# Patient Record
Sex: Female | Born: 1940 | Race: White | Hispanic: No | Smoking: Never smoker
Health system: Southern US, Community
[De-identification: ages and names within clinical notes are randomized; demographics above are authoritative.]

## PROBLEM LIST (undated history)

## (undated) DIAGNOSIS — I10 Essential (primary) hypertension: Secondary | ICD-10-CM

---

## 2000-08-23 ENCOUNTER — Other Ambulatory Visit: Admission: RE | Admit: 2000-08-23 | Discharge: 2000-08-23 | Payer: Self-pay | Admitting: Family Medicine

## 2000-10-19 ENCOUNTER — Ambulatory Visit (HOSPITAL_COMMUNITY): Admission: RE | Admit: 2000-10-19 | Discharge: 2000-10-19 | Payer: Self-pay | Admitting: Gastroenterology

## 2000-10-19 ENCOUNTER — Encounter (INDEPENDENT_AMBULATORY_CARE_PROVIDER_SITE_OTHER): Payer: Self-pay | Admitting: Specialist

## 2002-07-01 ENCOUNTER — Encounter: Payer: Self-pay | Admitting: Emergency Medicine

## 2002-07-01 ENCOUNTER — Emergency Department (HOSPITAL_COMMUNITY): Admission: EM | Admit: 2002-07-01 | Discharge: 2002-07-01 | Payer: Self-pay | Admitting: Emergency Medicine

## 2002-07-02 ENCOUNTER — Encounter (INDEPENDENT_AMBULATORY_CARE_PROVIDER_SITE_OTHER): Payer: Self-pay | Admitting: Cardiology

## 2002-07-02 ENCOUNTER — Ambulatory Visit (HOSPITAL_COMMUNITY): Admission: RE | Admit: 2002-07-02 | Discharge: 2002-07-02 | Payer: Self-pay | Admitting: Family Medicine

## 2003-12-01 ENCOUNTER — Other Ambulatory Visit: Admission: RE | Admit: 2003-12-01 | Discharge: 2003-12-01 | Payer: Self-pay | Admitting: Family Medicine

## 2004-11-16 ENCOUNTER — Ambulatory Visit (HOSPITAL_COMMUNITY): Admission: RE | Admit: 2004-11-16 | Discharge: 2004-11-16 | Payer: Self-pay | Admitting: Gastroenterology

## 2004-11-16 ENCOUNTER — Encounter (INDEPENDENT_AMBULATORY_CARE_PROVIDER_SITE_OTHER): Payer: Self-pay | Admitting: *Deleted

## 2004-12-07 ENCOUNTER — Other Ambulatory Visit: Admission: RE | Admit: 2004-12-07 | Discharge: 2004-12-07 | Payer: Self-pay | Admitting: Family Medicine

## 2005-12-26 ENCOUNTER — Other Ambulatory Visit: Admission: RE | Admit: 2005-12-26 | Discharge: 2005-12-26 | Payer: Self-pay | Admitting: Family Medicine

## 2006-12-28 ENCOUNTER — Other Ambulatory Visit: Admission: RE | Admit: 2006-12-28 | Discharge: 2006-12-28 | Payer: Self-pay | Admitting: Family Medicine

## 2007-11-06 ENCOUNTER — Encounter: Admission: RE | Admit: 2007-11-06 | Discharge: 2007-11-06 | Payer: Self-pay | Admitting: Family Medicine

## 2007-12-31 ENCOUNTER — Other Ambulatory Visit: Admission: RE | Admit: 2007-12-31 | Discharge: 2007-12-31 | Payer: Self-pay | Admitting: Family Medicine

## 2011-01-07 NOTE — Op Note (Signed)
NAMECASSIDEY, BARRALES             ACCOUNT NO.:  [id number]   MEDICAL RECORD NO.:  [id number]          PATIENT TYPE:  AMB   LOCATION:  ENDO                         FACILITY:  [facility]   PHYSICIAN:  Petra Kuba, M.D.    DATE OF BIRTH:  27-Oct-1940   DATE OF PROCEDURE:  11/16/2004  DATE OF DISCHARGE:                                 OPERATIVE REPORT   PROCEDURE:  Esophagogastroduodenoscopy with biopsy.   INDICATION:  Reflux, wanted to do a one-time endoscopy to rule out  Barrett's.   CONSENT:  Consent was signed after risks, benefits, methods, and options  were thoroughly discussed in the office.   ADDITIONAL MEDICATIONS FOR THIS PROCEDURE:  Demerol 20, Versed 2.   PROCEDURE:  The video endoscope was inserted by direct vision. The esophagus  was normal.  The scope was passed into the stomach and multiple gastric  polyps along the mid stomach were seen.  She did have significant linear  antritis.  The scope passed through a normal pylorus into a normal duodenal  bulb and around the celiac to a normal portion of the duodenum.  The scope  was withdrawn back to the bulb and a good look there ruled out abnormalities  in that location.  The scope was withdrawn back to the stomach and  retroflexed.  Cardia and fundus were normal.  There was noe proximal gastric  polyp, small, on retroflexion.  Straight visualization confirmed the polyps,  ruled out any other abnormalities.  A few biopsies of the antrum and the  linear antritis were obtained and put in a second container and a few  biopsies of multiple polyps were obtained including the one on retroflexion  and put in a third container.  Not all polyps were sampled.  Air was  suctioned and the scope was slowly withdrawn.  Again a good look at the  esophagus was normal.  The scope was removed.  The patient tolerated the  procedure well.  There was no obvious immediate complication.   ENDOSCOPIC DIAGNOSES:  1.  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  2.  Linear antritis, status post biopsy.  3.  Otherwise normal esophagogastroduodenoscopy.   PLAN:  1.  Await pathology.  2.  Continue pump inhibitors.  3.  Happy to see back p.r.n.  Otherwise return care to Dr. Andrey Campanile for the      customary healthcare maintenance and screening.      MEM/MEDQ  D:  11/16/2004  T:  11/16/2004  Job:  [id number]   cc:   Vale Haven. Andrey Campanile, M.D.  [address]  [place]  [place] [address]  Fax: [phone]

## 2011-01-07 NOTE — Op Note (Signed)
Ann Carlson, Ann Carlson             ACCOUNT NO.:  [id number]   MEDICAL RECORD NO.:  [id number]          PATIENT TYPE:  AMB   LOCATION:  ENDO                         FACILITY:  [facility]   PHYSICIAN:  Petra Kuba, M.D.    DATE OF BIRTH:  February 06, 1941   DATE OF PROCEDURE:  11/16/2004  DATE OF DISCHARGE:                                 OPERATIVE REPORT   PROCEDURES:  1.  Colonoscopy.  2.  Polypectomy.   INDICATIONS:  A patient with a history of colon polyps and family history of  colon polyps and colon cancer.  Due for colonic screening.  Consent was  signed after the risks, benefits, methods and options were thoroughly  discussed in the office on multiple occasions.   MEDICINES USED:  Demerol 40 mg and Versed 4 mg.   DESCRIPTION OF PROCEDURE:  Rectal inspection was pertinent for external  hemorrhoids, small.  The digital exam was negative.  The video pediatric  adjustable colonoscope was inserted and easily advanced around the colon to  the cecum.  This did require some abdominal pressure, but no position  changes.  No abnormality was seen on insertion.  The cecum was identified by  the appendiceal orifice and the ileocecal valve.  The scope was slowly  withdrawn.  Prep was fairly adequate.  With lots of washing and suctioning,  adequate visualization was obtained with the exception of around the splenic  flexure where some formed stool was seen, which could not be washed and  suctioned.  Otherwise the cecum, ascending, transverse, majority of the  descending and sigmoid were all normal, except for one distal to mid small  sigmoid polyp which was hot biopsied x 2.  No other abnormalities were seen.  We slowly withdrew back to the rectum.  Anorectal pull through and  retroflexion confirmed some small hemorrhoids.  The scope was straightened  and readvanced a short ways up the left side of the colon.  Air was suction  and scope removed.  The patient tolerated the procedure well.  There  were no  obvious immediate complications.   ENDOSCOPIC DIAGNOSES:  1.  Internal and external small hemorrhoids.  2.  Mid to distal tiny to small polyp hot biopsied x 2.  3.  Otherwise within normal limits to the cecum, except for some stool in      the proximal left splenic flexure which could not be washed off.   PLAN:  1.  Recheck colon screening in five years.  2.  Yearly rectals and guaiacs per Dr. Andrey Campanile.  3.  Await pathology to make sure of above.      MEM/MEDQ  D:  11/16/2004  T:  11/16/2004  Job:  [id number]   cc:   Vale Haven. Andrey Campanile, M.D.  [address]  [place]  [place] [address]  Fax: [phone]

## 2015-10-13 DIAGNOSIS — F418 Other specified anxiety disorders: Secondary | ICD-10-CM | POA: Diagnosis not present

## 2015-10-13 DIAGNOSIS — Z6839 Body mass index (BMI) 39.0-39.9, adult: Secondary | ICD-10-CM | POA: Diagnosis not present

## 2015-10-13 DIAGNOSIS — I10 Essential (primary) hypertension: Secondary | ICD-10-CM | POA: Diagnosis not present

## 2015-11-26 DIAGNOSIS — R7301 Impaired fasting glucose: Secondary | ICD-10-CM | POA: Diagnosis not present

## 2015-11-26 DIAGNOSIS — E784 Other hyperlipidemia: Secondary | ICD-10-CM | POA: Diagnosis not present

## 2015-11-26 DIAGNOSIS — K635 Polyp of colon: Secondary | ICD-10-CM | POA: Diagnosis not present

## 2015-11-26 DIAGNOSIS — Z1389 Encounter for screening for other disorder: Secondary | ICD-10-CM | POA: Diagnosis not present

## 2015-11-26 DIAGNOSIS — I10 Essential (primary) hypertension: Secondary | ICD-10-CM | POA: Diagnosis not present

## 2015-11-26 DIAGNOSIS — F418 Other specified anxiety disorders: Secondary | ICD-10-CM | POA: Diagnosis not present

## 2016-03-07 DIAGNOSIS — H40013 Open angle with borderline findings, low risk, bilateral: Secondary | ICD-10-CM | POA: Diagnosis not present

## 2016-06-03 DIAGNOSIS — Z803 Family history of malignant neoplasm of breast: Secondary | ICD-10-CM | POA: Diagnosis not present

## 2016-06-03 DIAGNOSIS — Z1231 Encounter for screening mammogram for malignant neoplasm of breast: Secondary | ICD-10-CM | POA: Diagnosis not present

## 2016-06-10 DIAGNOSIS — E785 Hyperlipidemia, unspecified: Secondary | ICD-10-CM | POA: Diagnosis not present

## 2016-06-10 DIAGNOSIS — R8299 Other abnormal findings in urine: Secondary | ICD-10-CM | POA: Diagnosis not present

## 2016-06-10 DIAGNOSIS — M859 Disorder of bone density and structure, unspecified: Secondary | ICD-10-CM | POA: Diagnosis not present

## 2016-06-10 DIAGNOSIS — I10 Essential (primary) hypertension: Secondary | ICD-10-CM | POA: Diagnosis not present

## 2016-06-10 DIAGNOSIS — E038 Other specified hypothyroidism: Secondary | ICD-10-CM | POA: Diagnosis not present

## 2016-06-10 DIAGNOSIS — R7301 Impaired fasting glucose: Secondary | ICD-10-CM | POA: Diagnosis not present

## 2016-06-10 DIAGNOSIS — N390 Urinary tract infection, site not specified: Secondary | ICD-10-CM | POA: Diagnosis not present

## 2016-06-17 DIAGNOSIS — M1990 Unspecified osteoarthritis, unspecified site: Secondary | ICD-10-CM | POA: Diagnosis not present

## 2016-06-17 DIAGNOSIS — E039 Hypothyroidism, unspecified: Secondary | ICD-10-CM | POA: Diagnosis not present

## 2016-06-17 DIAGNOSIS — Z0000 Encounter for general adult medical examination without abnormal findings: Secondary | ICD-10-CM | POA: Diagnosis not present

## 2016-06-17 DIAGNOSIS — R7301 Impaired fasting glucose: Secondary | ICD-10-CM | POA: Diagnosis not present

## 2016-06-17 DIAGNOSIS — M419 Scoliosis, unspecified: Secondary | ICD-10-CM | POA: Diagnosis not present

## 2016-06-17 DIAGNOSIS — I10 Essential (primary) hypertension: Secondary | ICD-10-CM | POA: Diagnosis not present

## 2016-06-17 DIAGNOSIS — K219 Gastro-esophageal reflux disease without esophagitis: Secondary | ICD-10-CM | POA: Diagnosis not present

## 2016-06-17 DIAGNOSIS — E785 Hyperlipidemia, unspecified: Secondary | ICD-10-CM | POA: Diagnosis not present

## 2016-06-17 DIAGNOSIS — M8580 Other specified disorders of bone density and structure, unspecified site: Secondary | ICD-10-CM | POA: Diagnosis not present

## 2016-06-23 DIAGNOSIS — Z1212 Encounter for screening for malignant neoplasm of rectum: Secondary | ICD-10-CM | POA: Diagnosis not present

## 2016-12-14 DIAGNOSIS — E038 Other specified hypothyroidism: Secondary | ICD-10-CM | POA: Diagnosis not present

## 2016-12-14 DIAGNOSIS — I10 Essential (primary) hypertension: Secondary | ICD-10-CM | POA: Diagnosis not present

## 2016-12-14 DIAGNOSIS — R7301 Impaired fasting glucose: Secondary | ICD-10-CM | POA: Diagnosis not present

## 2017-02-09 ENCOUNTER — Emergency Department (HOSPITAL_COMMUNITY): Payer: Medicare Other

## 2017-02-09 ENCOUNTER — Encounter (HOSPITAL_COMMUNITY): Payer: Self-pay | Admitting: Emergency Medicine

## 2017-02-09 ENCOUNTER — Emergency Department (HOSPITAL_COMMUNITY)
Admission: EM | Admit: 2017-02-09 | Discharge: 2017-02-09 | Disposition: A | Discharge status: Home / Self Care | Payer: Medicare Other | Attending: Emergency Medicine | Admitting: Emergency Medicine

## 2017-02-09 DIAGNOSIS — I10 Essential (primary) hypertension: Secondary | ICD-10-CM | POA: Diagnosis not present

## 2017-02-09 DIAGNOSIS — S42351A Displaced comminuted fracture of shaft of humerus, right arm, initial encounter for closed fracture: Secondary | ICD-10-CM | POA: Insufficient documentation

## 2017-02-09 DIAGNOSIS — Y939 Activity, unspecified: Secondary | ICD-10-CM | POA: Diagnosis not present

## 2017-02-09 DIAGNOSIS — Y929 Unspecified place or not applicable: Secondary | ICD-10-CM | POA: Insufficient documentation

## 2017-02-09 DIAGNOSIS — Y999 Unspecified external cause status: Secondary | ICD-10-CM | POA: Diagnosis not present

## 2017-02-09 DIAGNOSIS — W01198A Fall on same level from slipping, tripping and stumbling with subsequent striking against other object, initial encounter: Secondary | ICD-10-CM | POA: Diagnosis not present

## 2017-02-09 DIAGNOSIS — T148XXA Other injury of unspecified body region, initial encounter: Secondary | ICD-10-CM | POA: Diagnosis not present

## 2017-02-09 DIAGNOSIS — S59911A Unspecified injury of right forearm, initial encounter: Secondary | ICD-10-CM | POA: Diagnosis present

## 2017-02-09 DIAGNOSIS — S42201A Unspecified fracture of upper end of right humerus, initial encounter for closed fracture: Secondary | ICD-10-CM | POA: Diagnosis not present

## 2017-02-09 DIAGNOSIS — S42301A Unspecified fracture of shaft of humerus, right arm, initial encounter for closed fracture: Secondary | ICD-10-CM | POA: Diagnosis not present

## 2017-02-09 DIAGNOSIS — M79603 Pain in arm, unspecified: Secondary | ICD-10-CM | POA: Diagnosis not present

## 2017-02-09 HISTORY — DX: Essential (primary) hypertension: I10

## 2017-02-09 MED ORDER — OXYCODONE-ACETAMINOPHEN 5-325 MG PO TABS: 1.0000 | ORAL_TABLET | ORAL | 0 refills | Status: DC | PRN

## 2017-02-09 MED ORDER — MORPHINE SULFATE (PF) 4 MG/ML IV SOLN
2.0000 mg | Freq: Once | INTRAVENOUS | Status: AC
  Administered 2017-02-09: 2 mg via INTRAVENOUS
  Filled 2017-02-09: qty 1

## 2017-02-09 NOTE — ED Notes (Signed)
Bed: [id number] Expected date:  Expected time:  Means of arrival:  Comments: EMS 76 yo female fall at home-arm pain

## 2017-02-09 NOTE — ED Triage Notes (Signed)
Per EMS pt had a fall at home and is complaining of right arm pain. Pt denied any pain medication prior to arrival. Pt A&O x4.

## 2017-02-09 NOTE — ED Provider Notes (Signed)
[facility]-EMERGENCY DEPT Provider Note   CSN: [id number] Arrival date & time: 02/09/17  2009     History   Chief Complaint Chief Complaint  Patient presents with  . Fall    HPI Ann Carlson is a 76 y.o. female.  The history is provided by the patient and medical records.    76 year old female with history of hypertension, here after mechanical fall at home. Reports she was trying to clean a throw rug when she tripped and fell striking her left arm against a chair and ultimately falling to the floor. Reports she did hit her nose but denies any other head injury or loss of consciousness. States she had immediate onset of pain and swelling to her right upper arm. States pain does seem to be moving into the elbow somewhat. She denies any numbness. States when she tries to move her arms it feels like "it is floating". Patient is right-hand dominant. Last oral intake around 5:30 PM. She's not had any medications prior to arrival.  Past Medical History:  Diagnosis Date  . Hypertension     There are no active problems to display for this patient.   No past surgical history on file.  OB History    No data available       Home Medications    Prior to Admission medications   Not on File    Family History No family history on file.  Social History Social History  Substance Use Topics  . Smoking status: Never Smoker  . Smokeless tobacco: Never Used  . Alcohol use No     Allergies   Patient has no allergy information on record.   Review of Systems Review of Systems  Musculoskeletal: Positive for arthralgias.  All other systems reviewed and are negative.    Physical Exam Updated Vital Signs BP (!) 159/66 (BP Location: Left Arm)   Pulse 91   Temp 98.2 F (36.8 C) (Oral)   Resp 18   SpO2 100%   Physical Exam  Constitutional: She is oriented to person, place, and time. She appears well-developed and well-nourished.  HENT:  Head: Normocephalic and atraumatic.   Mouth/Throat: Oropharynx is clear and moist.  Abrasion noted to the nasal septum with dried blood along the philtrum, I do not appreciate any facial deformity or step-off, no septal hematoma or deformity No other signs of head trauma  Eyes: Conjunctivae and EOM are normal. Pupils are equal, round, and reactive to light.  Neck: Normal range of motion.  Cardiovascular: Normal rate, regular rhythm and normal heart sounds.   Pulmonary/Chest: Effort normal and breath sounds normal.  Abdominal: Soft. Bowel sounds are normal.  Musculoskeletal: Normal range of motion.  Right upper arm is swollen with obvious deformity, there is some overlying erythema and early stages of bruising, limited range of motion, no signs of shoulder dislocation, moving all fingers normally, normal sensation throughout, normal radial pulse Compartments soft  Neurological: She is alert and oriented to person, place, and time.  Skin: Skin is warm and dry.  Psychiatric: She has a normal mood and affect.  Nursing note and vitals reviewed.    ED Treatments / Results  Labs (all labs ordered are listed, but only abnormal results are displayed) Labs Reviewed - No data to display  EKG  EKG Interpretation None       Radiology Dg Humerus Right  Result Date: 02/09/2017 CLINICAL DATA:  76 year old female with fall and right arm pain. EXAM: RIGHT HUMERUS - 2+ VIEW COMPARISON:  None FINDINGS: There is a displaced and angulated comminuted fracture of the proximal and midportion of the right humerus. No definite dislocation seen on the provided images. There is arthritic changes of the right shoulder and right AC joint. The bones are osteopenic. The soft tissues appear unremarkable. IMPRESSION: Displaced and angulated comminuted fracture of the right humerus. Electronically Signed   By: Elgie CollardArash  Radparvar M.D.   On: 02/09/2017 21:06    Procedures Procedures (including critical care time)  Medications Ordered in ED Medications   morphine 4 MG/ML injection 2 mg (2 mg Intravenous Given 02/09/17 2059)     Initial Impression / Assessment and Plan / ED Course  I have reviewed the triage vital signs and the nursing notes.  Pertinent labs & imaging results that were available during my care of the patient were reviewed by me and considered in my medical decision making (see chart for details).  76 year old female here after mechanical fall at home. She impacted her right arm against a kitchen table chair. She has obvious swelling, bruising, and deformity of the right upper arm. Her arm is neurovascularly intact, compartments are soft. She has normal sensation throughout. X-ray obtained revealing displaced and angulated comminuted fracture. Will discuss with orthopedics.  9:41 PM Case discussed with orthopedics, Dr. Victorino DikeHewitt-- recommends coaptation splint, sling for now.  Will arrange follow-up for her in clinic.  Patient does express some concerns about her ability to care for herself at home given this is her dominant arm, social work was consulted.  Face to face evaluation ordered from CSW.  Case management will follow-up on this tomorrow.  Patient stable for discharge home with outpatient orthopedic follow-up.  Rx percocet for pain control.  Discussed plan with patient, she acknowledged understanding and agreed with plan of care.  Return precautions given for new or worsening symptoms.  Final Clinical Impressions(s) / ED Diagnoses   Final diagnoses:  Closed displaced comminuted fracture of shaft of right humerus, initial encounter    New Prescriptions New Prescriptions   OXYCODONE-ACETAMINOPHEN (PERCOCET) 5-325 MG TABLET    Take 1 tablet by mouth every 4 (four) hours as needed.     Garlon HatchetSanders, Apphia Cropley M, PA-C 02/09/17 2259    Gerhard MunchLockwood, Robert, MD 02/09/17 [phone]

## 2017-02-09 NOTE — Progress Notes (Signed)
CSW met with pt and friend was at bedside.  Pt clarified her worries about needing assistance at home once D/C'd.    CSW asked provider to place CM consult with orders for Home Health and Face-to-Face.  CSW confirmed with CM that CM would not be on-duty until the following morning.    CSW explained the role of Home Health to the best of his ability, explained SOP was for CM to call pt on 6/22 to further explain their role and seek to assist the pt to connect the pt with companies that will provide Home Health services to the pt once D/C'd. Pt was appreciative and thanked the CSW.  EDP and provider were updated.  No social work needs identified.  Ann Carlson. Ann Carlson, Ann Carlson, LCAS Clinical Social Worker Ph: [phone]

## 2017-02-09 NOTE — Discharge Instructions (Signed)
Take the prescribed medication as directed. Follow-up with Dr. Victorino DikeHewitt-- his office will be calling you tomorrow to scheduled some follow-up. I have placed an order for home health evaluation to see about getting you some assistance during your injury. Return to the ED for new or worsening symptoms.

## 2017-02-10 ENCOUNTER — Telehealth: Payer: Self-pay | Admitting: *Deleted

## 2017-02-10 ENCOUNTER — Encounter: Payer: Self-pay | Admitting: *Deleted

## 2017-02-10 DIAGNOSIS — S42291A Other displaced fracture of upper end of right humerus, initial encounter for closed fracture: Secondary | ICD-10-CM | POA: Diagnosis not present

## 2017-02-10 NOTE — Progress Notes (Signed)
EDCM notes order for home health services.  Will wait until after 0900 to call pt and set up services.

## 2017-02-10 NOTE — Telephone Encounter (Signed)
Indy Prestwood J. Ifeoluwa Beller, RN, BSN, NCM [phone] Spoke with pt at bedside regarding discharge planning for Home Health Services. Offered pt list of home health agencies to choose from.  Pt chose [facility] to render services. Karen Nussbaum of [facility] notified. Patient made aware that [facility] will be in contact in 24-48 hours.  No DME needs identified at this time.  

## 2017-02-14 DIAGNOSIS — E119 Type 2 diabetes mellitus without complications: Secondary | ICD-10-CM | POA: Diagnosis not present

## 2017-02-14 DIAGNOSIS — I10 Essential (primary) hypertension: Secondary | ICD-10-CM | POA: Diagnosis not present

## 2017-02-14 DIAGNOSIS — Z9181 History of falling: Secondary | ICD-10-CM | POA: Diagnosis not present

## 2017-02-14 DIAGNOSIS — S42201D Unspecified fracture of upper end of right humerus, subsequent encounter for fracture with routine healing: Secondary | ICD-10-CM | POA: Diagnosis not present

## 2017-02-27 DIAGNOSIS — S42291D Other displaced fracture of upper end of right humerus, subsequent encounter for fracture with routine healing: Secondary | ICD-10-CM | POA: Diagnosis not present

## 2017-03-27 DIAGNOSIS — S42291D Other displaced fracture of upper end of right humerus, subsequent encounter for fracture with routine healing: Secondary | ICD-10-CM | POA: Diagnosis not present

## 2017-03-27 DIAGNOSIS — Z9889 Other specified postprocedural states: Secondary | ICD-10-CM | POA: Diagnosis not present

## 2017-03-29 DIAGNOSIS — M25511 Pain in right shoulder: Secondary | ICD-10-CM | POA: Diagnosis not present

## 2017-03-29 DIAGNOSIS — S42291D Other displaced fracture of upper end of right humerus, subsequent encounter for fracture with routine healing: Secondary | ICD-10-CM | POA: Diagnosis not present

## 2017-04-05 DIAGNOSIS — M25511 Pain in right shoulder: Secondary | ICD-10-CM | POA: Diagnosis not present

## 2017-04-05 DIAGNOSIS — S42291D Other displaced fracture of upper end of right humerus, subsequent encounter for fracture with routine healing: Secondary | ICD-10-CM | POA: Diagnosis not present

## 2017-04-07 DIAGNOSIS — S42291D Other displaced fracture of upper end of right humerus, subsequent encounter for fracture with routine healing: Secondary | ICD-10-CM | POA: Diagnosis not present

## 2017-04-07 DIAGNOSIS — M25511 Pain in right shoulder: Secondary | ICD-10-CM | POA: Diagnosis not present

## 2017-04-10 DIAGNOSIS — M25511 Pain in right shoulder: Secondary | ICD-10-CM | POA: Diagnosis not present

## 2017-04-10 DIAGNOSIS — S42291D Other displaced fracture of upper end of right humerus, subsequent encounter for fracture with routine healing: Secondary | ICD-10-CM | POA: Diagnosis not present

## 2017-04-12 DIAGNOSIS — S42291D Other displaced fracture of upper end of right humerus, subsequent encounter for fracture with routine healing: Secondary | ICD-10-CM | POA: Diagnosis not present

## 2017-04-12 DIAGNOSIS — M25511 Pain in right shoulder: Secondary | ICD-10-CM | POA: Diagnosis not present

## 2017-04-17 DIAGNOSIS — S42291D Other displaced fracture of upper end of right humerus, subsequent encounter for fracture with routine healing: Secondary | ICD-10-CM | POA: Diagnosis not present

## 2017-04-17 DIAGNOSIS — M25511 Pain in right shoulder: Secondary | ICD-10-CM | POA: Diagnosis not present

## 2017-04-19 DIAGNOSIS — S42291D Other displaced fracture of upper end of right humerus, subsequent encounter for fracture with routine healing: Secondary | ICD-10-CM | POA: Diagnosis not present

## 2017-04-19 DIAGNOSIS — M25511 Pain in right shoulder: Secondary | ICD-10-CM | POA: Diagnosis not present

## 2017-04-25 DIAGNOSIS — S42291D Other displaced fracture of upper end of right humerus, subsequent encounter for fracture with routine healing: Secondary | ICD-10-CM | POA: Diagnosis not present

## 2017-04-25 DIAGNOSIS — M25511 Pain in right shoulder: Secondary | ICD-10-CM | POA: Diagnosis not present

## 2017-04-27 DIAGNOSIS — S42291D Other displaced fracture of upper end of right humerus, subsequent encounter for fracture with routine healing: Secondary | ICD-10-CM | POA: Diagnosis not present

## 2017-04-27 DIAGNOSIS — M25511 Pain in right shoulder: Secondary | ICD-10-CM | POA: Diagnosis not present

## 2017-05-01 DIAGNOSIS — S42291D Other displaced fracture of upper end of right humerus, subsequent encounter for fracture with routine healing: Secondary | ICD-10-CM | POA: Diagnosis not present

## 2017-05-01 DIAGNOSIS — M25511 Pain in right shoulder: Secondary | ICD-10-CM | POA: Diagnosis not present

## 2017-05-03 DIAGNOSIS — M25511 Pain in right shoulder: Secondary | ICD-10-CM | POA: Diagnosis not present

## 2017-05-03 DIAGNOSIS — S42291D Other displaced fracture of upper end of right humerus, subsequent encounter for fracture with routine healing: Secondary | ICD-10-CM | POA: Diagnosis not present

## 2017-05-08 DIAGNOSIS — S42291D Other displaced fracture of upper end of right humerus, subsequent encounter for fracture with routine healing: Secondary | ICD-10-CM | POA: Diagnosis not present

## 2017-05-08 DIAGNOSIS — M25511 Pain in right shoulder: Secondary | ICD-10-CM | POA: Diagnosis not present

## 2017-05-10 DIAGNOSIS — S42291D Other displaced fracture of upper end of right humerus, subsequent encounter for fracture with routine healing: Secondary | ICD-10-CM | POA: Diagnosis not present

## 2017-05-10 DIAGNOSIS — M25511 Pain in right shoulder: Secondary | ICD-10-CM | POA: Diagnosis not present

## 2017-05-15 DIAGNOSIS — S42291D Other displaced fracture of upper end of right humerus, subsequent encounter for fracture with routine healing: Secondary | ICD-10-CM | POA: Diagnosis not present

## 2017-05-15 DIAGNOSIS — M25511 Pain in right shoulder: Secondary | ICD-10-CM | POA: Diagnosis not present

## 2017-05-17 DIAGNOSIS — S42291D Other displaced fracture of upper end of right humerus, subsequent encounter for fracture with routine healing: Secondary | ICD-10-CM | POA: Diagnosis not present

## 2017-05-17 DIAGNOSIS — M25511 Pain in right shoulder: Secondary | ICD-10-CM | POA: Diagnosis not present

## 2017-05-22 DIAGNOSIS — M25511 Pain in right shoulder: Secondary | ICD-10-CM | POA: Diagnosis not present

## 2017-05-22 DIAGNOSIS — S42291D Other displaced fracture of upper end of right humerus, subsequent encounter for fracture with routine healing: Secondary | ICD-10-CM | POA: Diagnosis not present

## 2017-05-24 DIAGNOSIS — S42291D Other displaced fracture of upper end of right humerus, subsequent encounter for fracture with routine healing: Secondary | ICD-10-CM | POA: Diagnosis not present

## 2017-05-24 DIAGNOSIS — M25511 Pain in right shoulder: Secondary | ICD-10-CM | POA: Diagnosis not present

## 2017-05-29 DIAGNOSIS — S42291D Other displaced fracture of upper end of right humerus, subsequent encounter for fracture with routine healing: Secondary | ICD-10-CM | POA: Diagnosis not present

## 2017-05-29 DIAGNOSIS — M25511 Pain in right shoulder: Secondary | ICD-10-CM | POA: Diagnosis not present

## 2017-05-31 DIAGNOSIS — M25511 Pain in right shoulder: Secondary | ICD-10-CM | POA: Diagnosis not present

## 2017-05-31 DIAGNOSIS — S42291D Other displaced fracture of upper end of right humerus, subsequent encounter for fracture with routine healing: Secondary | ICD-10-CM | POA: Diagnosis not present

## 2017-06-04 DIAGNOSIS — Z1211 Encounter for screening for malignant neoplasm of colon: Secondary | ICD-10-CM | POA: Diagnosis not present

## 2017-06-04 DIAGNOSIS — Z0000 Encounter for general adult medical examination without abnormal findings: Secondary | ICD-10-CM | POA: Diagnosis not present

## 2017-06-05 DIAGNOSIS — S42291D Other displaced fracture of upper end of right humerus, subsequent encounter for fracture with routine healing: Secondary | ICD-10-CM | POA: Diagnosis not present

## 2017-06-05 DIAGNOSIS — M25511 Pain in right shoulder: Secondary | ICD-10-CM | POA: Diagnosis not present

## 2017-06-07 DIAGNOSIS — M25511 Pain in right shoulder: Secondary | ICD-10-CM | POA: Diagnosis not present

## 2017-06-07 DIAGNOSIS — S42291D Other displaced fracture of upper end of right humerus, subsequent encounter for fracture with routine healing: Secondary | ICD-10-CM | POA: Diagnosis not present

## 2017-06-12 DIAGNOSIS — S42291D Other displaced fracture of upper end of right humerus, subsequent encounter for fracture with routine healing: Secondary | ICD-10-CM | POA: Diagnosis not present

## 2017-06-12 DIAGNOSIS — M25511 Pain in right shoulder: Secondary | ICD-10-CM | POA: Diagnosis not present

## 2017-06-14 DIAGNOSIS — S42291D Other displaced fracture of upper end of right humerus, subsequent encounter for fracture with routine healing: Secondary | ICD-10-CM | POA: Diagnosis not present

## 2017-06-14 DIAGNOSIS — M25511 Pain in right shoulder: Secondary | ICD-10-CM | POA: Diagnosis not present

## 2017-06-17 DIAGNOSIS — Z23 Encounter for immunization: Secondary | ICD-10-CM | POA: Diagnosis not present

## 2017-06-19 DIAGNOSIS — S42291D Other displaced fracture of upper end of right humerus, subsequent encounter for fracture with routine healing: Secondary | ICD-10-CM | POA: Diagnosis not present

## 2017-06-19 DIAGNOSIS — M25511 Pain in right shoulder: Secondary | ICD-10-CM | POA: Diagnosis not present

## 2017-06-21 DIAGNOSIS — M25511 Pain in right shoulder: Secondary | ICD-10-CM | POA: Diagnosis not present

## 2017-06-30 DIAGNOSIS — I10 Essential (primary) hypertension: Secondary | ICD-10-CM | POA: Diagnosis not present

## 2017-06-30 DIAGNOSIS — E038 Other specified hypothyroidism: Secondary | ICD-10-CM | POA: Diagnosis not present

## 2017-06-30 DIAGNOSIS — Z1231 Encounter for screening mammogram for malignant neoplasm of breast: Secondary | ICD-10-CM | POA: Diagnosis not present

## 2017-06-30 DIAGNOSIS — R82998 Other abnormal findings in urine: Secondary | ICD-10-CM | POA: Diagnosis not present

## 2017-06-30 DIAGNOSIS — Z803 Family history of malignant neoplasm of breast: Secondary | ICD-10-CM | POA: Diagnosis not present

## 2017-06-30 DIAGNOSIS — R7301 Impaired fasting glucose: Secondary | ICD-10-CM | POA: Diagnosis not present

## 2017-06-30 DIAGNOSIS — E7849 Other hyperlipidemia: Secondary | ICD-10-CM | POA: Diagnosis not present

## 2017-07-06 DIAGNOSIS — Z0000 Encounter for general adult medical examination without abnormal findings: Secondary | ICD-10-CM | POA: Diagnosis not present

## 2017-07-06 DIAGNOSIS — E7849 Other hyperlipidemia: Secondary | ICD-10-CM | POA: Diagnosis not present

## 2017-07-06 DIAGNOSIS — I10 Essential (primary) hypertension: Secondary | ICD-10-CM | POA: Diagnosis not present

## 2017-07-18 DIAGNOSIS — Z1212 Encounter for screening for malignant neoplasm of rectum: Secondary | ICD-10-CM | POA: Diagnosis not present

## 2017-07-18 DIAGNOSIS — Z1211 Encounter for screening for malignant neoplasm of colon: Secondary | ICD-10-CM | POA: Diagnosis not present

## 2017-11-29 DIAGNOSIS — R7301 Impaired fasting glucose: Secondary | ICD-10-CM | POA: Diagnosis not present

## 2017-11-29 DIAGNOSIS — I10 Essential (primary) hypertension: Secondary | ICD-10-CM | POA: Diagnosis not present

## 2017-11-29 DIAGNOSIS — M859 Disorder of bone density and structure, unspecified: Secondary | ICD-10-CM | POA: Diagnosis not present

## 2018-03-22 DIAGNOSIS — N390 Urinary tract infection, site not specified: Secondary | ICD-10-CM | POA: Diagnosis not present

## 2018-03-22 DIAGNOSIS — R300 Dysuria: Secondary | ICD-10-CM | POA: Diagnosis not present

## 2018-04-06 DIAGNOSIS — E785 Hyperlipidemia, unspecified: Secondary | ICD-10-CM | POA: Diagnosis not present

## 2018-04-06 DIAGNOSIS — R7301 Impaired fasting glucose: Secondary | ICD-10-CM | POA: Diagnosis not present

## 2018-04-06 DIAGNOSIS — I10 Essential (primary) hypertension: Secondary | ICD-10-CM | POA: Diagnosis not present

## 2018-04-06 DIAGNOSIS — N390 Urinary tract infection, site not specified: Secondary | ICD-10-CM | POA: Diagnosis not present

## 2018-04-13 DIAGNOSIS — H40013 Open angle with borderline findings, low risk, bilateral: Secondary | ICD-10-CM | POA: Diagnosis not present

## 2018-04-18 DIAGNOSIS — M1712 Unilateral primary osteoarthritis, left knee: Secondary | ICD-10-CM | POA: Diagnosis not present

## 2018-04-18 DIAGNOSIS — M1711 Unilateral primary osteoarthritis, right knee: Secondary | ICD-10-CM | POA: Diagnosis not present

## 2018-06-13 DIAGNOSIS — Z23 Encounter for immunization: Secondary | ICD-10-CM | POA: Diagnosis not present

## 2018-06-18 DIAGNOSIS — L820 Inflamed seborrheic keratosis: Secondary | ICD-10-CM | POA: Diagnosis not present

## 2018-06-22 DIAGNOSIS — M1711 Unilateral primary osteoarthritis, right knee: Secondary | ICD-10-CM | POA: Diagnosis not present

## 2018-07-04 DIAGNOSIS — Z803 Family history of malignant neoplasm of breast: Secondary | ICD-10-CM | POA: Diagnosis not present

## 2018-07-04 DIAGNOSIS — Z1231 Encounter for screening mammogram for malignant neoplasm of breast: Secondary | ICD-10-CM | POA: Diagnosis not present

## 2018-07-06 DIAGNOSIS — M1712 Unilateral primary osteoarthritis, left knee: Secondary | ICD-10-CM | POA: Diagnosis not present

## 2018-07-06 DIAGNOSIS — M1711 Unilateral primary osteoarthritis, right knee: Secondary | ICD-10-CM | POA: Diagnosis not present

## 2018-07-13 DIAGNOSIS — M1712 Unilateral primary osteoarthritis, left knee: Secondary | ICD-10-CM | POA: Diagnosis not present

## 2018-07-13 DIAGNOSIS — M1711 Unilateral primary osteoarthritis, right knee: Secondary | ICD-10-CM | POA: Diagnosis not present

## 2018-07-23 DIAGNOSIS — M1711 Unilateral primary osteoarthritis, right knee: Secondary | ICD-10-CM | POA: Diagnosis not present

## 2018-07-23 DIAGNOSIS — M1712 Unilateral primary osteoarthritis, left knee: Secondary | ICD-10-CM | POA: Diagnosis not present

## 2018-07-31 DIAGNOSIS — E7849 Other hyperlipidemia: Secondary | ICD-10-CM | POA: Diagnosis not present

## 2018-07-31 DIAGNOSIS — E038 Other specified hypothyroidism: Secondary | ICD-10-CM | POA: Diagnosis not present

## 2018-07-31 DIAGNOSIS — M859 Disorder of bone density and structure, unspecified: Secondary | ICD-10-CM | POA: Diagnosis not present

## 2018-07-31 DIAGNOSIS — I10 Essential (primary) hypertension: Secondary | ICD-10-CM | POA: Diagnosis not present

## 2018-07-31 DIAGNOSIS — R82998 Other abnormal findings in urine: Secondary | ICD-10-CM | POA: Diagnosis not present

## 2018-08-08 DIAGNOSIS — Z0000 Encounter for general adult medical examination without abnormal findings: Secondary | ICD-10-CM | POA: Diagnosis not present

## 2018-08-08 DIAGNOSIS — E1169 Type 2 diabetes mellitus with other specified complication: Secondary | ICD-10-CM | POA: Diagnosis not present

## 2018-08-08 DIAGNOSIS — N390 Urinary tract infection, site not specified: Secondary | ICD-10-CM | POA: Diagnosis not present

## 2018-08-08 DIAGNOSIS — I10 Essential (primary) hypertension: Secondary | ICD-10-CM | POA: Diagnosis not present

## 2018-08-10 DIAGNOSIS — Z1212 Encounter for screening for malignant neoplasm of rectum: Secondary | ICD-10-CM | POA: Diagnosis not present

## 2018-12-08 IMAGING — CR DG HUMERUS 2V *R*
3 series · 3 of 3 positions shown · non-contrast
Comparison: None

CLINICAL DATA: 75-year-old female with fall and right arm pain.

EXAM:
RIGHT HUMERUS - 2+ VIEW

[x humerus lat right (1 of 2)]
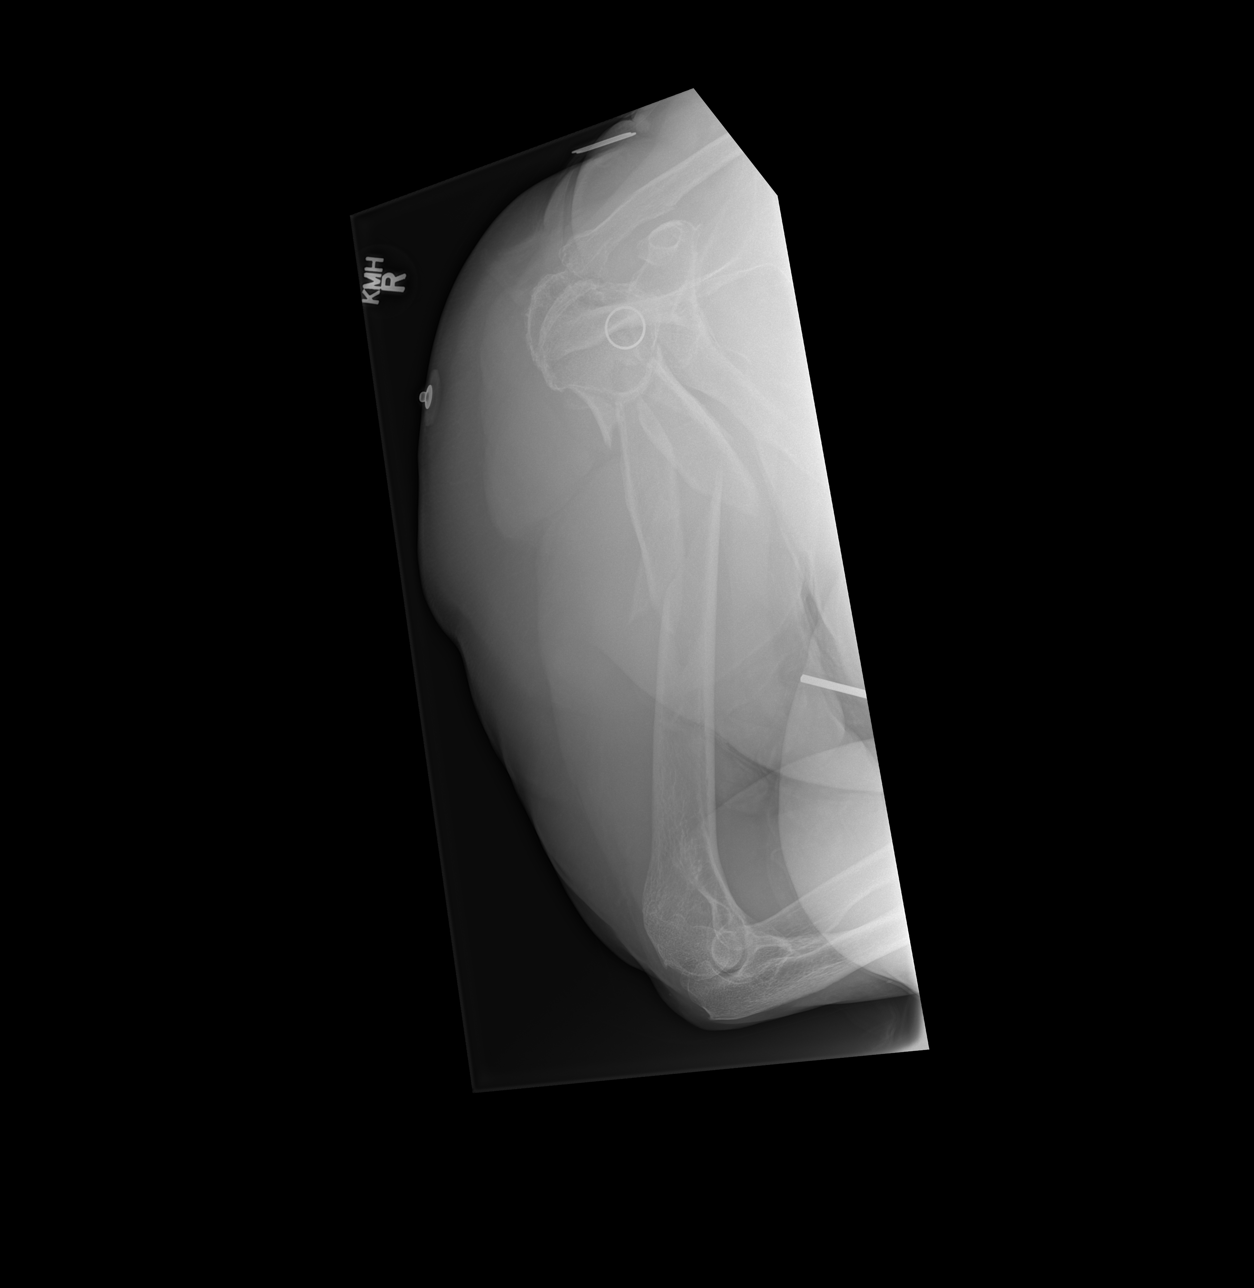

[x humerus lat right (2 of 2)]
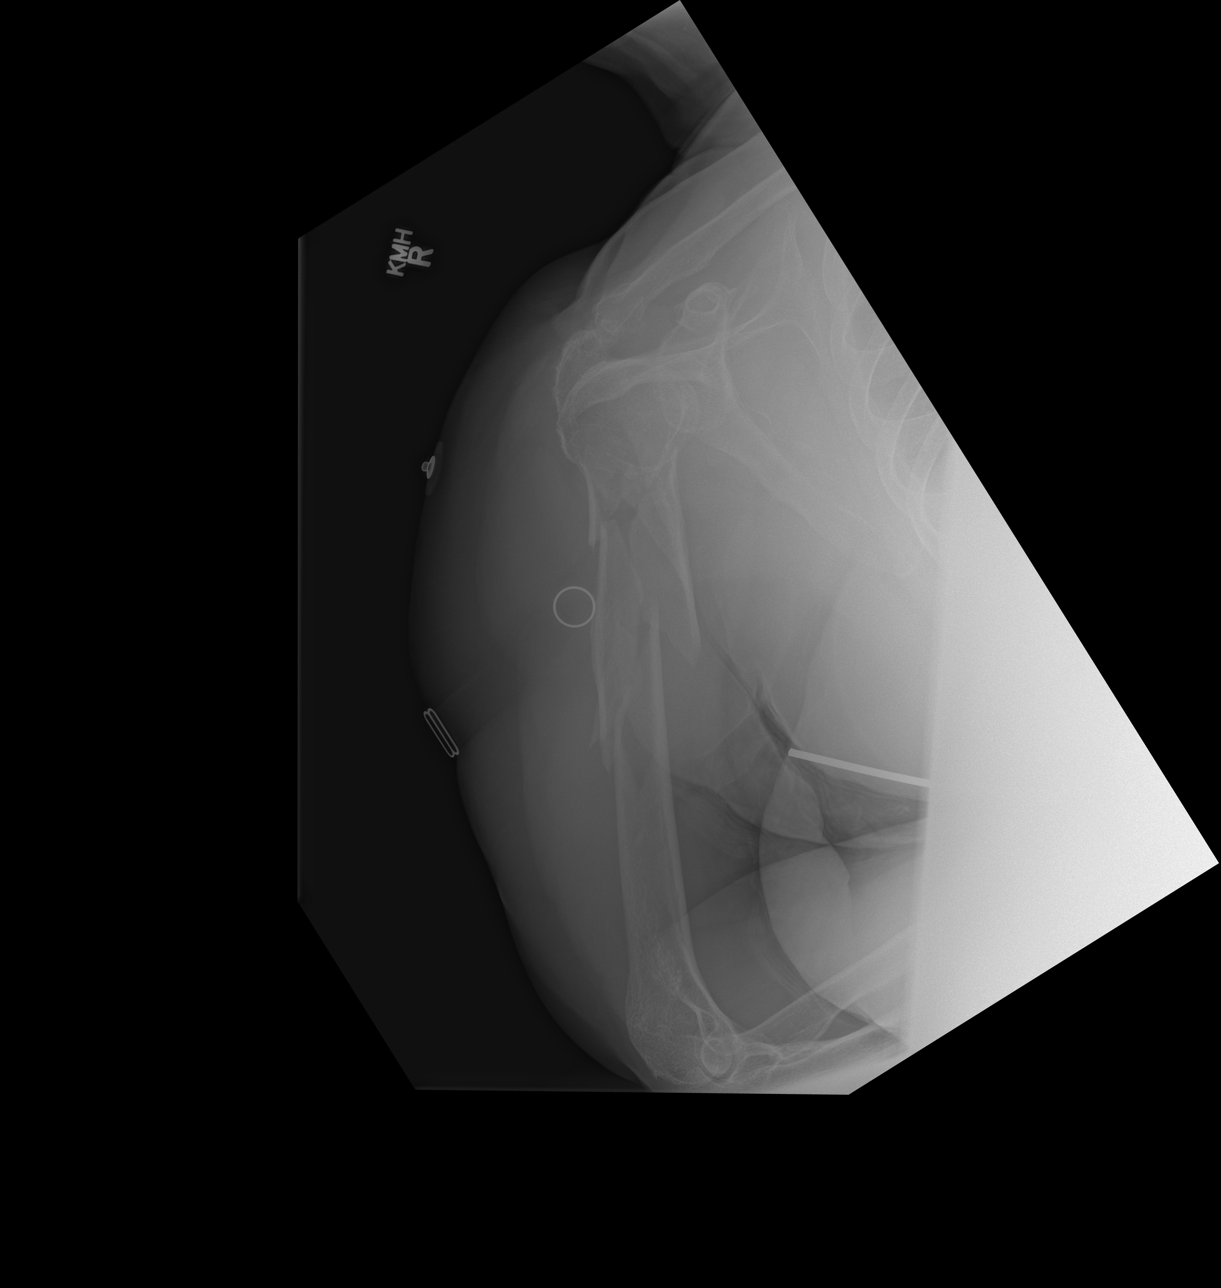

[w trans-thoracic humerus]
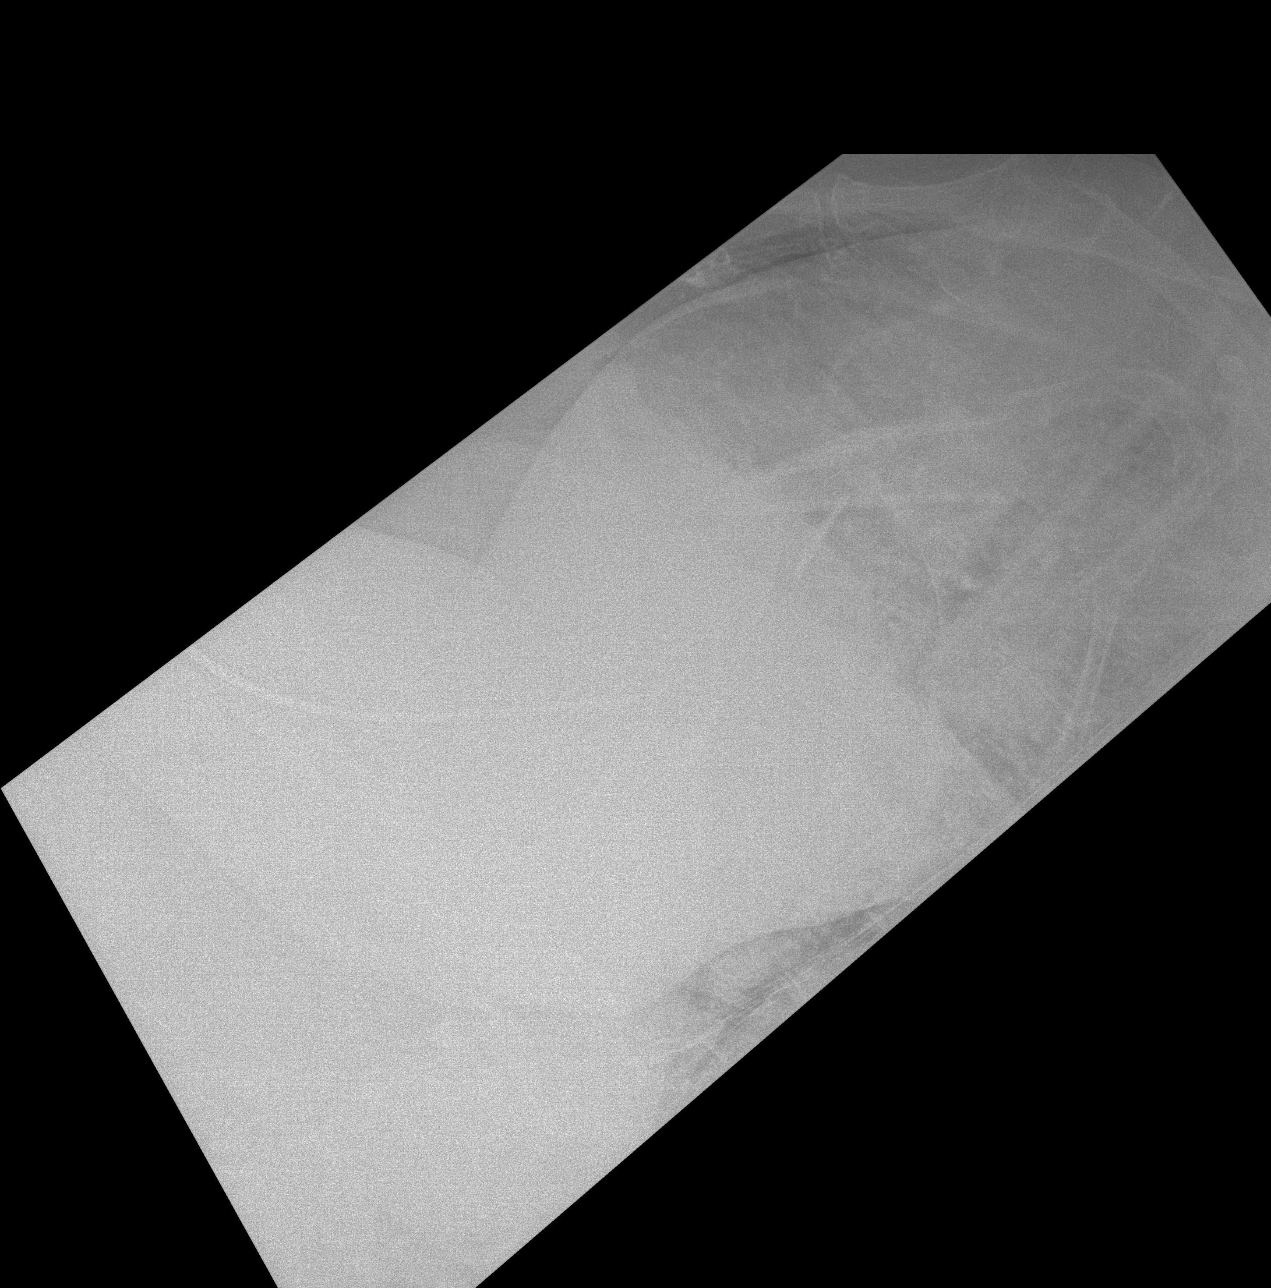

[3 of 3 positions shown; findings below may reference images not displayed]

FINDINGS: There is a displaced and angulated comminuted fracture of the
proximal and midportion of the right humerus. No definite
dislocation seen on the provided images. There is arthritic changes
of the right shoulder and right AC joint. The bones are osteopenic.
The soft tissues appear unremarkable.
IMPRESSION: Displaced and angulated comminuted fracture of the right humerus.

## 2019-01-07 DIAGNOSIS — R8290 Unspecified abnormal findings in urine: Secondary | ICD-10-CM | POA: Diagnosis not present

## 2019-01-07 DIAGNOSIS — N390 Urinary tract infection, site not specified: Secondary | ICD-10-CM | POA: Diagnosis not present

## 2019-02-12 DIAGNOSIS — M1712 Unilateral primary osteoarthritis, left knee: Secondary | ICD-10-CM | POA: Diagnosis not present

## 2019-02-12 DIAGNOSIS — M25561 Pain in right knee: Secondary | ICD-10-CM | POA: Diagnosis not present

## 2019-02-12 DIAGNOSIS — M25562 Pain in left knee: Secondary | ICD-10-CM | POA: Diagnosis not present

## 2019-02-12 DIAGNOSIS — M1711 Unilateral primary osteoarthritis, right knee: Secondary | ICD-10-CM | POA: Diagnosis not present

## 2019-02-20 DIAGNOSIS — M1712 Unilateral primary osteoarthritis, left knee: Secondary | ICD-10-CM | POA: Diagnosis not present

## 2019-02-20 DIAGNOSIS — M1711 Unilateral primary osteoarthritis, right knee: Secondary | ICD-10-CM | POA: Diagnosis not present

## 2019-02-27 DIAGNOSIS — M1711 Unilateral primary osteoarthritis, right knee: Secondary | ICD-10-CM | POA: Diagnosis not present

## 2019-02-27 DIAGNOSIS — M1712 Unilateral primary osteoarthritis, left knee: Secondary | ICD-10-CM | POA: Diagnosis not present

## 2019-03-06 DIAGNOSIS — M1712 Unilateral primary osteoarthritis, left knee: Secondary | ICD-10-CM | POA: Diagnosis not present

## 2019-03-06 DIAGNOSIS — M1711 Unilateral primary osteoarthritis, right knee: Secondary | ICD-10-CM | POA: Diagnosis not present

## 2019-05-20 DIAGNOSIS — Z0120 Encounter for dental examination and cleaning without abnormal findings: Secondary | ICD-10-CM | POA: Diagnosis not present

## 2019-05-24 DIAGNOSIS — Z23 Encounter for immunization: Secondary | ICD-10-CM | POA: Diagnosis not present

## 2019-07-10 DIAGNOSIS — Z803 Family history of malignant neoplasm of breast: Secondary | ICD-10-CM | POA: Diagnosis not present

## 2019-07-10 DIAGNOSIS — Z1231 Encounter for screening mammogram for malignant neoplasm of breast: Secondary | ICD-10-CM | POA: Diagnosis not present

## 2019-08-29 DIAGNOSIS — E7849 Other hyperlipidemia: Secondary | ICD-10-CM | POA: Diagnosis not present

## 2019-08-29 DIAGNOSIS — R82998 Other abnormal findings in urine: Secondary | ICD-10-CM | POA: Diagnosis not present

## 2019-08-29 DIAGNOSIS — E038 Other specified hypothyroidism: Secondary | ICD-10-CM | POA: Diagnosis not present

## 2019-08-29 DIAGNOSIS — M859 Disorder of bone density and structure, unspecified: Secondary | ICD-10-CM | POA: Diagnosis not present

## 2019-08-29 DIAGNOSIS — E1169 Type 2 diabetes mellitus with other specified complication: Secondary | ICD-10-CM | POA: Diagnosis not present

## 2019-08-29 DIAGNOSIS — Z1212 Encounter for screening for malignant neoplasm of rectum: Secondary | ICD-10-CM | POA: Diagnosis not present

## 2019-09-06 DIAGNOSIS — N1832 Chronic kidney disease, stage 3b: Secondary | ICD-10-CM | POA: Diagnosis not present

## 2019-09-06 DIAGNOSIS — Z1389 Encounter for screening for other disorder: Secondary | ICD-10-CM | POA: Diagnosis not present

## 2019-09-06 DIAGNOSIS — Z0000 Encounter for general adult medical examination without abnormal findings: Secondary | ICD-10-CM | POA: Diagnosis not present

## 2019-09-06 DIAGNOSIS — E1169 Type 2 diabetes mellitus with other specified complication: Secondary | ICD-10-CM | POA: Diagnosis not present

## 2019-09-06 DIAGNOSIS — I129 Hypertensive chronic kidney disease with stage 1 through stage 4 chronic kidney disease, or unspecified chronic kidney disease: Secondary | ICD-10-CM | POA: Diagnosis not present

## 2019-09-12 ENCOUNTER — Ambulatory Visit: Payer: Medicare Other | Attending: Internal Medicine

## 2019-09-12 DIAGNOSIS — Z23 Encounter for immunization: Secondary | ICD-10-CM | POA: Insufficient documentation

## 2019-09-12 NOTE — Progress Notes (Signed)
   Covid-19 Vaccination Clinic  Name:  Ann Carlson    MRN: [id number] DOB: 1941-06-28  09/12/2019  Ms. Ann Carlson was observed post Covid-19 immunization for 15 minutes without incidence. She was provided with Vaccine Information Sheet and instruction to access the V-Safe system.   Ms. Ann Carlson was instructed to call 911 with any severe reactions post vaccine: Marland Kitchen Difficulty breathing  . Swelling of your face and throat  . A fast heartbeat  . A bad rash all over your body  . Dizziness and weakness    Immunizations Administered    Name Date Dose VIS Date Route   Pfizer COVID-19 Vaccine 09/12/2019 11:55 AM 0.3 mL 08/02/2019 Intramuscular   Manufacturer: [employer], [employer]   Lot: [id number]   NDC: [address]-7

## 2019-10-03 ENCOUNTER — Ambulatory Visit: Payer: Medicare Other | Attending: Internal Medicine

## 2019-10-03 DIAGNOSIS — Z23 Encounter for immunization: Secondary | ICD-10-CM | POA: Insufficient documentation

## 2019-10-03 NOTE — Progress Notes (Signed)
   Covid-19 Vaccination Clinic  Name:  Ann Carlson    MRN: [id number] DOB: [birth year]  10/03/2019  [name] was observed post Covid-19 immunization for 15 minutes without incidence. She was provided with Vaccine Information Sheet and instruction to access the V-Safe system.   [name] was instructed to call 911 with any severe reactions post vaccine: Marland Kitchen Difficulty breathing  . Swelling of your face and throat  . A fast heartbeat  . A bad rash all over your body  . Dizziness and weakness    Immunizations Administered    Name Date Dose VIS Date Route   Pfizer COVID-19 Vaccine 10/03/2019 11:45 AM 0.3 mL 08/02/2019 Intramuscular   Manufacturer: [employer], [employer]   Lot: [id number]   NDC: [address]-1

## 2019-11-18 DIAGNOSIS — Z0120 Encounter for dental examination and cleaning without abnormal findings: Secondary | ICD-10-CM | POA: Diagnosis not present

## 2020-03-25 DIAGNOSIS — M1712 Unilateral primary osteoarthritis, left knee: Secondary | ICD-10-CM | POA: Diagnosis not present

## 2020-04-06 DIAGNOSIS — M1712 Unilateral primary osteoarthritis, left knee: Secondary | ICD-10-CM | POA: Diagnosis not present

## 2020-04-20 DIAGNOSIS — M1712 Unilateral primary osteoarthritis, left knee: Secondary | ICD-10-CM | POA: Diagnosis not present

## 2020-04-29 DIAGNOSIS — M1712 Unilateral primary osteoarthritis, left knee: Secondary | ICD-10-CM | POA: Diagnosis not present

## 2020-05-23 DIAGNOSIS — Z23 Encounter for immunization: Secondary | ICD-10-CM | POA: Diagnosis not present

## 2020-07-10 DIAGNOSIS — Z1231 Encounter for screening mammogram for malignant neoplasm of breast: Secondary | ICD-10-CM | POA: Diagnosis not present

## 2020-08-31 DIAGNOSIS — E1169 Type 2 diabetes mellitus with other specified complication: Secondary | ICD-10-CM | POA: Diagnosis not present

## 2020-08-31 DIAGNOSIS — M859 Disorder of bone density and structure, unspecified: Secondary | ICD-10-CM | POA: Diagnosis not present

## 2020-08-31 DIAGNOSIS — E785 Hyperlipidemia, unspecified: Secondary | ICD-10-CM | POA: Diagnosis not present

## 2020-08-31 DIAGNOSIS — E039 Hypothyroidism, unspecified: Secondary | ICD-10-CM | POA: Diagnosis not present

## 2020-09-04 DIAGNOSIS — N1832 Chronic kidney disease, stage 3b: Secondary | ICD-10-CM | POA: Diagnosis not present

## 2020-09-04 DIAGNOSIS — Z0000 Encounter for general adult medical examination without abnormal findings: Secondary | ICD-10-CM | POA: Diagnosis not present

## 2020-09-04 DIAGNOSIS — I10 Essential (primary) hypertension: Secondary | ICD-10-CM | POA: Diagnosis not present

## 2020-09-04 DIAGNOSIS — E1169 Type 2 diabetes mellitus with other specified complication: Secondary | ICD-10-CM | POA: Diagnosis not present

## 2020-09-04 DIAGNOSIS — R82998 Other abnormal findings in urine: Secondary | ICD-10-CM | POA: Diagnosis not present

## 2020-09-10 DIAGNOSIS — Z1212 Encounter for screening for malignant neoplasm of rectum: Secondary | ICD-10-CM | POA: Diagnosis not present

## 2020-10-21 ENCOUNTER — Ambulatory Visit (INDEPENDENT_AMBULATORY_CARE_PROVIDER_SITE_OTHER): Payer: Self-pay | Admitting: Plastic Surgery

## 2020-10-21 ENCOUNTER — Other Ambulatory Visit: Payer: Self-pay

## 2020-10-21 ENCOUNTER — Encounter: Payer: Self-pay | Admitting: Plastic Surgery

## 2020-10-21 VITALS — BP 160/72 | HR 78 | Temp 97.3°F | Ht 62.0 in | Wt 204.0 lb

## 2020-10-21 DIAGNOSIS — Z411 Encounter for cosmetic surgery: Secondary | ICD-10-CM

## 2020-10-21 NOTE — Progress Notes (Signed)
Patient presents to discuss Botox treatment.  She is bothered by the dynamic and static lines in the forehead, glabella and crows feet.  She has had Botox before and wanted to get a little bit of the lift of her eyebrows with possible.  We discussed the risks and benefits of Botox treatment and she elected to proceed.  The face was prepped and alcohol pad and 40 units of Botox were distributed between the crows feet, left forehead and glabella.  I did place some in the lateral aspect of the brow to try to give it a bit of a lift or arch in that location.  She tolerated this fine.  I have suggested she make a 2-week touchup appointment so that it would be there if needed.  Otherwise we will see her at her next visit.  All of her questions were answered.

## 2020-11-04 ENCOUNTER — Ambulatory Visit (INDEPENDENT_AMBULATORY_CARE_PROVIDER_SITE_OTHER): Payer: Self-pay | Admitting: Plastic Surgery

## 2020-11-04 ENCOUNTER — Other Ambulatory Visit: Payer: Self-pay

## 2020-11-04 DIAGNOSIS — Z411 Encounter for cosmetic surgery: Secondary | ICD-10-CM

## 2020-11-04 NOTE — Progress Notes (Signed)
Patient is here to discuss her recent Botox treatment.  I distributed 40 units between the forehead, glabella and crows feet.  She is bothered because she feels like there is less space between her eyebrow and upper lid after the injection on the right side.  On examination at rest her eyebrows are fairly symmetric.  She does have a bit more activation of the lateral frontalis on the left side compared to the right.  I think this is affecting the appearance of the space just beneath her right eyebrow.  I explained that the only thing that I could do to help with symmetry would be to give a slight amount more on the left side but she is not interested in this at this point.  I do expect 6 to 8 weeks from now the effects will begin to wear off and she will go back to her preprocedure level of brow activation.  We will plan to see her again at her next visit.  All her questions were answered.

## 2021-01-01 DIAGNOSIS — H5213 Myopia, bilateral: Secondary | ICD-10-CM | POA: Diagnosis not present

## 2021-02-10 DIAGNOSIS — Z1211 Encounter for screening for malignant neoplasm of colon: Secondary | ICD-10-CM | POA: Diagnosis not present

## 2021-02-10 DIAGNOSIS — Z1212 Encounter for screening for malignant neoplasm of rectum: Secondary | ICD-10-CM | POA: Diagnosis not present

## 2021-02-24 DIAGNOSIS — M1712 Unilateral primary osteoarthritis, left knee: Secondary | ICD-10-CM | POA: Diagnosis not present

## 2021-04-30 DIAGNOSIS — M1712 Unilateral primary osteoarthritis, left knee: Secondary | ICD-10-CM | POA: Diagnosis not present

## 2021-04-30 DIAGNOSIS — M1711 Unilateral primary osteoarthritis, right knee: Secondary | ICD-10-CM | POA: Diagnosis not present

## 2021-05-07 DIAGNOSIS — M25562 Pain in left knee: Secondary | ICD-10-CM | POA: Diagnosis not present

## 2021-05-07 DIAGNOSIS — M25561 Pain in right knee: Secondary | ICD-10-CM | POA: Diagnosis not present

## 2021-05-14 DIAGNOSIS — M1711 Unilateral primary osteoarthritis, right knee: Secondary | ICD-10-CM | POA: Diagnosis not present

## 2021-05-14 DIAGNOSIS — M1712 Unilateral primary osteoarthritis, left knee: Secondary | ICD-10-CM | POA: Diagnosis not present

## 2021-06-07 DIAGNOSIS — R110 Nausea: Secondary | ICD-10-CM | POA: Diagnosis not present

## 2021-06-07 DIAGNOSIS — R509 Fever, unspecified: Secondary | ICD-10-CM | POA: Diagnosis not present

## 2021-06-07 DIAGNOSIS — R5383 Other fatigue: Secondary | ICD-10-CM | POA: Diagnosis not present

## 2021-06-07 DIAGNOSIS — Z1152 Encounter for screening for COVID-19: Secondary | ICD-10-CM | POA: Diagnosis not present

## 2021-06-12 DIAGNOSIS — Z23 Encounter for immunization: Secondary | ICD-10-CM | POA: Diagnosis not present

## 2021-09-01 DIAGNOSIS — Z1231 Encounter for screening mammogram for malignant neoplasm of breast: Secondary | ICD-10-CM | POA: Diagnosis not present

## 2021-10-27 DIAGNOSIS — E785 Hyperlipidemia, unspecified: Secondary | ICD-10-CM | POA: Diagnosis not present

## 2021-10-27 DIAGNOSIS — I10 Essential (primary) hypertension: Secondary | ICD-10-CM | POA: Diagnosis not present

## 2021-10-27 DIAGNOSIS — E039 Hypothyroidism, unspecified: Secondary | ICD-10-CM | POA: Diagnosis not present

## 2021-10-27 DIAGNOSIS — E1169 Type 2 diabetes mellitus with other specified complication: Secondary | ICD-10-CM | POA: Diagnosis not present

## 2021-11-02 DIAGNOSIS — E785 Hyperlipidemia, unspecified: Secondary | ICD-10-CM | POA: Diagnosis not present

## 2021-11-03 DIAGNOSIS — I10 Essential (primary) hypertension: Secondary | ICD-10-CM | POA: Diagnosis not present

## 2021-11-03 DIAGNOSIS — Z0000 Encounter for general adult medical examination without abnormal findings: Secondary | ICD-10-CM | POA: Diagnosis not present

## 2021-11-03 DIAGNOSIS — E1169 Type 2 diabetes mellitus with other specified complication: Secondary | ICD-10-CM | POA: Diagnosis not present

## 2021-11-03 DIAGNOSIS — N1832 Chronic kidney disease, stage 3b: Secondary | ICD-10-CM | POA: Diagnosis not present

## 2021-11-03 DIAGNOSIS — Z1212 Encounter for screening for malignant neoplasm of rectum: Secondary | ICD-10-CM | POA: Diagnosis not present

## 2021-11-03 DIAGNOSIS — R82998 Other abnormal findings in urine: Secondary | ICD-10-CM | POA: Diagnosis not present

## 2022-03-14 DIAGNOSIS — H5213 Myopia, bilateral: Secondary | ICD-10-CM | POA: Diagnosis not present

## 2022-06-04 DIAGNOSIS — Z23 Encounter for immunization: Secondary | ICD-10-CM | POA: Diagnosis not present

## 2022-09-07 DIAGNOSIS — Z1231 Encounter for screening mammogram for malignant neoplasm of breast: Secondary | ICD-10-CM | POA: Diagnosis not present

## 2022-11-09 DIAGNOSIS — E1169 Type 2 diabetes mellitus with other specified complication: Secondary | ICD-10-CM | POA: Diagnosis not present

## 2022-11-09 DIAGNOSIS — R7989 Other specified abnormal findings of blood chemistry: Secondary | ICD-10-CM | POA: Diagnosis not present

## 2022-11-09 DIAGNOSIS — I10 Essential (primary) hypertension: Secondary | ICD-10-CM | POA: Diagnosis not present

## 2022-11-09 DIAGNOSIS — N1832 Chronic kidney disease, stage 3b: Secondary | ICD-10-CM | POA: Diagnosis not present

## 2022-11-09 DIAGNOSIS — E785 Hyperlipidemia, unspecified: Secondary | ICD-10-CM | POA: Diagnosis not present

## 2022-11-09 DIAGNOSIS — E039 Hypothyroidism, unspecified: Secondary | ICD-10-CM | POA: Diagnosis not present

## 2022-11-16 DIAGNOSIS — Z23 Encounter for immunization: Secondary | ICD-10-CM | POA: Diagnosis not present

## 2022-11-16 DIAGNOSIS — R82998 Other abnormal findings in urine: Secondary | ICD-10-CM | POA: Diagnosis not present

## 2022-11-16 DIAGNOSIS — E1169 Type 2 diabetes mellitus with other specified complication: Secondary | ICD-10-CM | POA: Diagnosis not present

## 2022-11-16 DIAGNOSIS — Z1339 Encounter for screening examination for other mental health and behavioral disorders: Secondary | ICD-10-CM | POA: Diagnosis not present

## 2022-11-16 DIAGNOSIS — Z1331 Encounter for screening for depression: Secondary | ICD-10-CM | POA: Diagnosis not present

## 2022-11-16 DIAGNOSIS — I10 Essential (primary) hypertension: Secondary | ICD-10-CM | POA: Diagnosis not present

## 2022-11-16 DIAGNOSIS — Z0000 Encounter for general adult medical examination without abnormal findings: Secondary | ICD-10-CM | POA: Diagnosis not present

## 2022-11-16 DIAGNOSIS — E785 Hyperlipidemia, unspecified: Secondary | ICD-10-CM | POA: Diagnosis not present

## 2023-02-15 DIAGNOSIS — K080 Exfoliation of teeth due to systemic causes: Secondary | ICD-10-CM | POA: Diagnosis not present

## 2023-03-01 DIAGNOSIS — M1712 Unilateral primary osteoarthritis, left knee: Secondary | ICD-10-CM | POA: Diagnosis not present

## 2023-03-01 DIAGNOSIS — M170 Bilateral primary osteoarthritis of knee: Secondary | ICD-10-CM | POA: Diagnosis not present

## 2023-03-01 DIAGNOSIS — M1711 Unilateral primary osteoarthritis, right knee: Secondary | ICD-10-CM | POA: Diagnosis not present

## 2023-04-17 DIAGNOSIS — M1711 Unilateral primary osteoarthritis, right knee: Secondary | ICD-10-CM | POA: Diagnosis not present

## 2023-04-19 DIAGNOSIS — M25562 Pain in left knee: Secondary | ICD-10-CM | POA: Diagnosis not present

## 2023-04-26 DIAGNOSIS — M1711 Unilateral primary osteoarthritis, right knee: Secondary | ICD-10-CM | POA: Diagnosis not present

## 2023-04-28 DIAGNOSIS — M25562 Pain in left knee: Secondary | ICD-10-CM | POA: Diagnosis not present

## 2023-05-03 DIAGNOSIS — M1711 Unilateral primary osteoarthritis, right knee: Secondary | ICD-10-CM | POA: Diagnosis not present

## 2023-05-05 DIAGNOSIS — M1712 Unilateral primary osteoarthritis, left knee: Secondary | ICD-10-CM | POA: Diagnosis not present

## 2023-06-10 DIAGNOSIS — Z23 Encounter for immunization: Secondary | ICD-10-CM | POA: Diagnosis not present

## 2023-09-27 DIAGNOSIS — K080 Exfoliation of teeth due to systemic causes: Secondary | ICD-10-CM | POA: Diagnosis not present

## 2023-10-24 DIAGNOSIS — Z1231 Encounter for screening mammogram for malignant neoplasm of breast: Secondary | ICD-10-CM | POA: Diagnosis not present

## 2023-11-16 DIAGNOSIS — H00014 Hordeolum externum left upper eyelid: Secondary | ICD-10-CM | POA: Diagnosis not present

## 2023-11-16 DIAGNOSIS — H01004 Unspecified blepharitis left upper eyelid: Secondary | ICD-10-CM | POA: Diagnosis not present

## 2023-11-24 DIAGNOSIS — E785 Hyperlipidemia, unspecified: Secondary | ICD-10-CM | POA: Diagnosis not present

## 2023-11-24 DIAGNOSIS — E039 Hypothyroidism, unspecified: Secondary | ICD-10-CM | POA: Diagnosis not present

## 2023-11-24 DIAGNOSIS — M8580 Other specified disorders of bone density and structure, unspecified site: Secondary | ICD-10-CM | POA: Diagnosis not present

## 2023-12-01 DIAGNOSIS — Z1339 Encounter for screening examination for other mental health and behavioral disorders: Secondary | ICD-10-CM | POA: Diagnosis not present

## 2023-12-01 DIAGNOSIS — Z1331 Encounter for screening for depression: Secondary | ICD-10-CM | POA: Diagnosis not present

## 2023-12-01 DIAGNOSIS — Z0000 Encounter for general adult medical examination without abnormal findings: Secondary | ICD-10-CM | POA: Diagnosis not present

## 2023-12-01 DIAGNOSIS — R82998 Other abnormal findings in urine: Secondary | ICD-10-CM | POA: Diagnosis not present

## 2023-12-01 DIAGNOSIS — I10 Essential (primary) hypertension: Secondary | ICD-10-CM | POA: Diagnosis not present

## 2023-12-01 DIAGNOSIS — E1169 Type 2 diabetes mellitus with other specified complication: Secondary | ICD-10-CM | POA: Diagnosis not present

## 2023-12-23 DIAGNOSIS — M533 Sacrococcygeal disorders, not elsewhere classified: Secondary | ICD-10-CM | POA: Diagnosis not present

## 2023-12-26 DIAGNOSIS — M5416 Radiculopathy, lumbar region: Secondary | ICD-10-CM | POA: Diagnosis not present

## 2023-12-26 DIAGNOSIS — R2689 Other abnormalities of gait and mobility: Secondary | ICD-10-CM | POA: Diagnosis not present

## 2023-12-29 DIAGNOSIS — R2689 Other abnormalities of gait and mobility: Secondary | ICD-10-CM | POA: Diagnosis not present

## 2023-12-29 DIAGNOSIS — M5416 Radiculopathy, lumbar region: Secondary | ICD-10-CM | POA: Diagnosis not present

## 2024-01-02 DIAGNOSIS — M5416 Radiculopathy, lumbar region: Secondary | ICD-10-CM | POA: Diagnosis not present

## 2024-01-02 DIAGNOSIS — R2689 Other abnormalities of gait and mobility: Secondary | ICD-10-CM | POA: Diagnosis not present

## 2024-01-05 DIAGNOSIS — M5416 Radiculopathy, lumbar region: Secondary | ICD-10-CM | POA: Diagnosis not present

## 2024-01-05 DIAGNOSIS — R2689 Other abnormalities of gait and mobility: Secondary | ICD-10-CM | POA: Diagnosis not present

## 2024-01-08 DIAGNOSIS — M5416 Radiculopathy, lumbar region: Secondary | ICD-10-CM | POA: Diagnosis not present

## 2024-01-08 DIAGNOSIS — R2689 Other abnormalities of gait and mobility: Secondary | ICD-10-CM | POA: Diagnosis not present

## 2024-01-10 DIAGNOSIS — M5416 Radiculopathy, lumbar region: Secondary | ICD-10-CM | POA: Diagnosis not present

## 2024-01-10 DIAGNOSIS — R2689 Other abnormalities of gait and mobility: Secondary | ICD-10-CM | POA: Diagnosis not present

## 2024-01-16 DIAGNOSIS — M5416 Radiculopathy, lumbar region: Secondary | ICD-10-CM | POA: Diagnosis not present

## 2024-01-16 DIAGNOSIS — R2689 Other abnormalities of gait and mobility: Secondary | ICD-10-CM | POA: Diagnosis not present

## 2024-01-18 DIAGNOSIS — R2689 Other abnormalities of gait and mobility: Secondary | ICD-10-CM | POA: Diagnosis not present

## 2024-01-18 DIAGNOSIS — M5416 Radiculopathy, lumbar region: Secondary | ICD-10-CM | POA: Diagnosis not present

## 2024-01-23 DIAGNOSIS — M5416 Radiculopathy, lumbar region: Secondary | ICD-10-CM | POA: Diagnosis not present

## 2024-01-23 DIAGNOSIS — R2689 Other abnormalities of gait and mobility: Secondary | ICD-10-CM | POA: Diagnosis not present

## 2024-01-25 DIAGNOSIS — M5416 Radiculopathy, lumbar region: Secondary | ICD-10-CM | POA: Diagnosis not present

## 2024-01-25 DIAGNOSIS — R2689 Other abnormalities of gait and mobility: Secondary | ICD-10-CM | POA: Diagnosis not present

## 2024-01-29 DIAGNOSIS — M5416 Radiculopathy, lumbar region: Secondary | ICD-10-CM | POA: Diagnosis not present

## 2024-01-29 DIAGNOSIS — R2689 Other abnormalities of gait and mobility: Secondary | ICD-10-CM | POA: Diagnosis not present

## 2024-02-01 DIAGNOSIS — R2689 Other abnormalities of gait and mobility: Secondary | ICD-10-CM | POA: Diagnosis not present

## 2024-02-01 DIAGNOSIS — M5416 Radiculopathy, lumbar region: Secondary | ICD-10-CM | POA: Diagnosis not present

## 2024-02-05 DIAGNOSIS — R2689 Other abnormalities of gait and mobility: Secondary | ICD-10-CM | POA: Diagnosis not present

## 2024-02-05 DIAGNOSIS — M5416 Radiculopathy, lumbar region: Secondary | ICD-10-CM | POA: Diagnosis not present

## 2024-02-08 DIAGNOSIS — R2689 Other abnormalities of gait and mobility: Secondary | ICD-10-CM | POA: Diagnosis not present

## 2024-02-08 DIAGNOSIS — M5416 Radiculopathy, lumbar region: Secondary | ICD-10-CM | POA: Diagnosis not present

## 2024-02-12 DIAGNOSIS — R2689 Other abnormalities of gait and mobility: Secondary | ICD-10-CM | POA: Diagnosis not present

## 2024-02-12 DIAGNOSIS — M5416 Radiculopathy, lumbar region: Secondary | ICD-10-CM | POA: Diagnosis not present

## 2024-02-15 DIAGNOSIS — R2689 Other abnormalities of gait and mobility: Secondary | ICD-10-CM | POA: Diagnosis not present

## 2024-02-15 DIAGNOSIS — M5416 Radiculopathy, lumbar region: Secondary | ICD-10-CM | POA: Diagnosis not present

## 2024-02-19 DIAGNOSIS — Z1211 Encounter for screening for malignant neoplasm of colon: Secondary | ICD-10-CM | POA: Diagnosis not present

## 2024-03-05 DIAGNOSIS — M170 Bilateral primary osteoarthritis of knee: Secondary | ICD-10-CM | POA: Diagnosis not present

## 2024-03-25 DIAGNOSIS — M1711 Unilateral primary osteoarthritis, right knee: Secondary | ICD-10-CM | POA: Diagnosis not present

## 2024-03-26 DIAGNOSIS — M1712 Unilateral primary osteoarthritis, left knee: Secondary | ICD-10-CM | POA: Diagnosis not present

## 2024-04-01 DIAGNOSIS — M1711 Unilateral primary osteoarthritis, right knee: Secondary | ICD-10-CM | POA: Diagnosis not present

## 2024-04-02 DIAGNOSIS — M1712 Unilateral primary osteoarthritis, left knee: Secondary | ICD-10-CM | POA: Diagnosis not present

## 2024-04-08 DIAGNOSIS — M1711 Unilateral primary osteoarthritis, right knee: Secondary | ICD-10-CM | POA: Diagnosis not present

## 2024-04-09 DIAGNOSIS — M1712 Unilateral primary osteoarthritis, left knee: Secondary | ICD-10-CM | POA: Diagnosis not present

## 2024-04-10 DIAGNOSIS — K080 Exfoliation of teeth due to systemic causes: Secondary | ICD-10-CM | POA: Diagnosis not present

## 2024-05-25 DIAGNOSIS — Z23 Encounter for immunization: Secondary | ICD-10-CM | POA: Diagnosis not present

## 2024-09-12 DIAGNOSIS — M1711 Unilateral primary osteoarthritis, right knee: Secondary | ICD-10-CM | POA: Insufficient documentation

## 2024-09-12 DIAGNOSIS — M1712 Unilateral primary osteoarthritis, left knee: Secondary | ICD-10-CM | POA: Insufficient documentation

## 2024-09-12 NOTE — Progress Notes (Signed)
 " Radiation Oncology         [phone] ________________________________  Name: Ann Carlson        MRN: [id number]  Date of Service: 09/13/2024 DOB: 1941-06-11  RR:Jccj, Ravisankar, MD  [name] ORN, MD     REFERRING PHYSICIAN: [name] ORN, MD   DIAGNOSIS: The primary encounter diagnosis was Primary osteoarthritis of left knee. A diagnosis of Primary osteoarthritis of right knee was also pertinent to this visit. M17.12, M17.11    HISTORY OF PRESENT ILLNESS: Ann Carlson is a 84 y.o. female seen in consultation for radiation therapy.  She has a hx of longstanding bilateral knee osteoarthritis. It has become more problematic over the last few years. She has previously undergone bilateral Synvisc injection therapy with persistent symptoms. She manages her sx with PRN tylenol  and meloxicam. Has used ice/cold therapy without much relief.  Prior knee radiographs performed and interpreted by her orthopedic surgeon revealed end stage degenerative changes of the L knee, predominantly involving the medial compartment, w/ subchondral sclerosis, osseous remodeling and complete joint space loss. Radiographs of the right knee demonstrated advanced degenerative changes with approximately 75% loss of medial and lateral joint space.   PREVIOUS RADIATION THERAPY: No  AUTOIMMUNE DISEASE: No  MEDICAL DEVICES: No  PREGNANCY: No   PAST MEDICAL HISTORY:  Past Medical History:  Diagnosis Date   Hypertension       FAMILY HISTORY: No family history on file.   SOCIAL HISTORY:  reports that she has never smoked. She has never used smokeless tobacco. She reports that she does not drink alcohol  and does not use drugs.   ALLERGIES: Patient has no known allergies.   MEDICATIONS:  Current Outpatient Medications  Medication Sig Dispense Refill   atorvastatin (LIPITOR) 20 MG tablet Take 20 mg by mouth every evening.  7   cholecalciferol (VITAMIN D) 1000 units tablet Take 1,000  Units by mouth daily.     esomeprazole (NEXIUM) 40 MG capsule Take 40 mg by mouth every evening.     hydrochlorothiazide (MICROZIDE) 12.5 MG capsule Take 12.5 mg by mouth daily after breakfast.  10   levothyroxine (SYNTHROID, LEVOTHROID) 50 MCG tablet Take 50 mcg by mouth daily before breakfast.     meloxicam (MOBIC) 15 MG tablet Take 15 mg by mouth daily.     metoprolol succinate (TOPROL-XL) 25 MG 24 hr tablet Take 25 mg by mouth 2 (two) times daily.  5   vitamin E 400 UNIT capsule Take 400 Units by mouth daily.     No current facility-administered medications for this encounter.     REVIEW OF SYSTEMS: The patient reports that she is doing well overall and a review of symptoms is otherwise negative.      PHYSICAL EXAM:  Wt Readings from Last 3 Encounters:  09/13/24 208 lb 12.8 oz (94.7 kg)  10/21/20 204 lb (92.5 kg)   Temp Readings from Last 3 Encounters:  09/13/24 97.7 F (36.5 C)  10/21/20 (!) 97.3 F (36.3 C) (Temporal)  02/09/17 98.4 F (36.9 C) (Oral)   BP Readings from Last 3 Encounters:  09/13/24 (!) 172/68  10/21/20 (!) 160/72  02/09/17 (!) 142/76   Pulse Readings from Last 3 Encounters:  09/13/24 85  10/21/20 78  02/09/17 78   Pain Assessment Pain Score: 0-No pain/10   Physical Exam Vitals and nursing note reviewed.  Constitutional:      General: She is not in acute distress. HENT:     Head: Normocephalic and atraumatic.  Eyes:     Extraocular Movements: Extraocular movements intact.  Cardiovascular:     Rate and Rhythm: Normal rate.  Pulmonary:     Effort: Pulmonary effort is normal. No respiratory distress.  Abdominal:     General: There is no distension.  Musculoskeletal:        General: Normal range of motion.     Cervical back: Normal range of motion.  Skin:    Coloration: Skin is not jaundiced or pale.  Neurological:     General: No focal deficit present.     Mental Status: She is alert.  Psychiatric:        Mood and Affect: Mood  normal.       LABORATORY DATA:  No results found for: WBC, HGB, HCT, MCV, PLT No results found for: NA, K, CL, CO2 No results found for: ALT, AST, GGT, ALKPHOS, BILITOT    RADIOGRAPHY:   Prior knee radiographs performed and interpreted by her orthopedic surgeon revealed end stage degenerative changes of the L knee, predominantly involving the medial compartment, w/ subchondral sclerosis, osseous remodeling and complete joint space loss. Radiographs of the right knee demonstrated advanced degenerative changes with approximately 75% loss of medial and lateral joint space.    PATHOLOGY: no pertinent pathology     IMPRESSION/PLAN:  [name] is an 84 yo F w/ severe end stage osteoarthritis of the L knee and moderate-severe osteoarthritis of the R knee, not adequately responsive to conservative measures and interfering with mobility and quality of life.  After a detailed discussion of the patients history, prior treatment response, and current goals, we reviewed the proposed protocol for LDRT targeting the bilateral knees. Our institutional approach follows a regimen of 3 Gy total, delivered in 6 fractions of 0.5 Gy per fraction over 2-3 weeks. This dosing is consistent with published guidelines and peer-reviewed data for non-malignant musculoskeletal conditions.  We reviewed:   Goals of care: symptom relief and improved mobility Expected timeline for therapeutic benefit: typically several weeks to months Potential risks, including rare but theoretical concerns regarding late tissue effects or secondary malignancy (risk estimated to be extremely low at this dose and age) Lack of immunosuppression and inactive autoimmune history, minimizing concern for immune-mediated complications   My goal with low dose radiation therapy is to help reduce chronic joint pain and improve mobility, particularly for activities like walking. This treatment does not reverse joint  damage, but it can calm the inflammation in and around the joint that contributes to pain. Based on published data, about 60-80% of patients with osteoarthritis experience meaningful pain relief within a few weeks to months after completing a short course of low-dose radiation. While not everyone responds, the majority of patients do report improvement, and the treatment is generally well tolerated.  The patient was agreeable to proceed. We will arrange CT simulation for planning and initiate treatment pending final review and setup. A follow-up visit will be scheduled approximately 8-12 weeks after completion of LDRT to assess clinical response.   We personally spent 60 minutes in this encounter including chart review, reviewing radiological studies, meeting face-to-face with the patient, entering orders and completing documentation.    Estefana HERO. Maritza, M.D.   "

## 2024-09-12 NOTE — Progress Notes (Signed)
 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 Patient reports having pain for several years.   What over the counter or prescription medications have you tried?  Gel injections and Tylenol .   Does anything make the pain better or worse? Pain worsens when standing.        Ambulatory status? Walker? Wheelchair?: Ambulatory  SAFETY ISSUES: Prior radiation? no Pacemaker/ICD? no Possible current pregnancy? no Is the patient on methotrexate? no  Current Complaints / other details:     BP (!) 172/68 (BP Location: Left Arm, Patient Position: Sitting, Cuff Size: Large)   Pulse 85   Temp 97.7 F (36.5 C)   Resp 18   Ht 5' 2 (1.575 m)   Wt 208 lb 12.8 oz (94.7 kg)   SpO2 100%   BMI 38.19 kg/m

## 2024-09-13 ENCOUNTER — Ambulatory Visit
Admission: RE | Admit: 2024-09-13 | Discharge: 2024-09-13 | Disposition: A | Source: Ambulatory Visit | Attending: Radiation Oncology

## 2024-09-13 ENCOUNTER — Encounter: Payer: Self-pay | Admitting: Radiation Oncology

## 2024-09-13 ENCOUNTER — Ambulatory Visit
Admission: RE | Admit: 2024-09-13 | Discharge: 2024-09-13 | Disposition: A | Source: Ambulatory Visit | Attending: Radiation Oncology | Admitting: Radiation Oncology

## 2024-09-13 VITALS — BP 172/68 | HR 85 | Temp 97.7°F | Resp 18 | Ht 62.0 in | Wt 208.8 lb

## 2024-09-13 DIAGNOSIS — I10 Essential (primary) hypertension: Secondary | ICD-10-CM | POA: Insufficient documentation

## 2024-09-13 DIAGNOSIS — M1711 Unilateral primary osteoarthritis, right knee: Secondary | ICD-10-CM | POA: Insufficient documentation

## 2024-09-13 DIAGNOSIS — Z79899 Other long term (current) drug therapy: Secondary | ICD-10-CM | POA: Insufficient documentation

## 2024-09-13 DIAGNOSIS — Z79890 Hormone replacement therapy (postmenopausal): Secondary | ICD-10-CM | POA: Insufficient documentation

## 2024-09-13 DIAGNOSIS — Z791 Long term (current) use of non-steroidal anti-inflammatories (NSAID): Secondary | ICD-10-CM | POA: Insufficient documentation

## 2024-09-13 DIAGNOSIS — M1712 Unilateral primary osteoarthritis, left knee: Secondary | ICD-10-CM

## 2024-09-30 ENCOUNTER — Ambulatory Visit: Admitting: Radiation Oncology

## 2024-10-09 ENCOUNTER — Ambulatory Visit: Admitting: Radiation Oncology

## 2024-10-11 ENCOUNTER — Ambulatory Visit

## 2024-10-14 ENCOUNTER — Ambulatory Visit

## 2024-10-16 ENCOUNTER — Ambulatory Visit

## 2024-10-17 ENCOUNTER — Ambulatory Visit

## 2024-10-18 ENCOUNTER — Ambulatory Visit

## 2024-10-21 ENCOUNTER — Ambulatory Visit
